# Patient Record
Sex: Female | Born: 1990 | Race: White | Hispanic: No | Marital: Single | Smoking: Current some day smoker
Health system: Southern US, Community
[De-identification: ages and names within clinical notes are randomized; demographics above are authoritative.]

---

## 2014-05-12 ENCOUNTER — Emergency Department (HOSPITAL_COMMUNITY)
Admission: EM | Admit: 2014-05-12 | Discharge: 2014-05-12 | Disposition: A | Discharge status: Home / Self Care | Payer: PRIVATE HEALTH INSURANCE | Attending: Emergency Medicine | Admitting: Emergency Medicine

## 2014-05-12 ENCOUNTER — Emergency Department (HOSPITAL_COMMUNITY): Payer: PRIVATE HEALTH INSURANCE

## 2014-05-12 ENCOUNTER — Encounter (HOSPITAL_COMMUNITY): Payer: Self-pay | Admitting: Emergency Medicine

## 2014-05-12 DIAGNOSIS — Z720 Tobacco use: Secondary | ICD-10-CM | POA: Insufficient documentation

## 2014-05-12 DIAGNOSIS — R109 Unspecified abdominal pain: Secondary | ICD-10-CM

## 2014-05-12 DIAGNOSIS — N23 Unspecified renal colic: Secondary | ICD-10-CM | POA: Insufficient documentation

## 2014-05-12 DIAGNOSIS — Z3202 Encounter for pregnancy test, result negative: Secondary | ICD-10-CM | POA: Insufficient documentation

## 2014-05-12 DIAGNOSIS — N390 Urinary tract infection, site not specified: Secondary | ICD-10-CM | POA: Diagnosis not present

## 2014-05-12 LAB — CBC WITH DIFFERENTIAL/PLATELET
BASOS ABS: 0 10*3/uL (ref 0.0–0.1)
Basophils Relative: 0 % (ref 0–1)
EOS ABS: 0.1 10*3/uL (ref 0.0–0.7)
EOS PCT: 1 % (ref 0–5)
HCT: 41.9 % (ref 36.0–46.0)
HEMOGLOBIN: 14.4 g/dL (ref 12.0–15.0)
LYMPHS PCT: 44 % (ref 12–46)
Lymphs Abs: 3 10*3/uL (ref 0.7–4.0)
MCH: 30 pg (ref 26.0–34.0)
MCHC: 34.4 g/dL (ref 30.0–36.0)
MCV: 87.3 fL (ref 78.0–100.0)
MONO ABS: 0.4 10*3/uL (ref 0.1–1.0)
Monocytes Relative: 6 % (ref 3–12)
Neutro Abs: 3.4 10*3/uL (ref 1.7–7.7)
Neutrophils Relative %: 49 % (ref 43–77)
PLATELETS: 186 10*3/uL (ref 150–400)
RBC: 4.8 MIL/uL (ref 3.87–5.11)
RDW: 11.8 % (ref 11.5–15.5)
WBC: 6.9 10*3/uL (ref 4.0–10.5)

## 2014-05-12 LAB — URINALYSIS, ROUTINE W REFLEX MICROSCOPIC
GLUCOSE, UA: 100 mg/dL — AB
Nitrite: POSITIVE — AB
PH: 6.5 (ref 5.0–8.0)
Protein, ur: 100 mg/dL — AB
Specific Gravity, Urine: >1.03 — ABNORMAL HIGH (ref 1.005–1.030)
Urobilinogen, UA: 2 mg/dL — ABNORMAL HIGH (ref 0.0–1.0)

## 2014-05-12 LAB — COMPREHENSIVE METABOLIC PANEL
ALK PHOS: 62 U/L (ref 39–117)
ALT: 17 U/L (ref 0–35)
ANION GAP: 4 — AB (ref 5–15)
AST: 20 U/L (ref 0–37)
Albumin: 4.4 g/dL (ref 3.5–5.2)
BUN: 7 mg/dL (ref 6–23)
CHLORIDE: 107 mmol/L (ref 96–112)
CO2: 25 mmol/L (ref 19–32)
Calcium: 9.3 mg/dL (ref 8.4–10.5)
Creatinine, Ser: 0.89 mg/dL (ref 0.50–1.10)
GFR calc non Af Amer: >90 mL/min (ref >90)
GLUCOSE: 99 mg/dL (ref 70–99)
POTASSIUM: 3.4 mmol/L — AB (ref 3.5–5.1)
Sodium: 136 mmol/L (ref 135–145)
TOTAL PROTEIN: 7.5 g/dL (ref 6.0–8.3)
Total Bilirubin: 0.4 mg/dL (ref 0.3–1.2)

## 2014-05-12 LAB — URINE MICROSCOPIC-ADD ON

## 2014-05-12 LAB — PREGNANCY, URINE: Preg Test, Ur: NEGATIVE

## 2014-05-12 MED ORDER — KETOROLAC TROMETHAMINE 30 MG/ML IJ SOLN
30.0000 mg | Freq: Once | INTRAMUSCULAR | Status: AC
  Administered 2014-05-12: 30 mg via INTRAVENOUS
  Filled 2014-05-12: qty 1

## 2014-05-12 MED ORDER — OXYCODONE-ACETAMINOPHEN 5-325 MG PO TABS: 1.0000 | ORAL_TABLET | ORAL | Status: AC | PRN

## 2014-05-12 MED ORDER — ONDANSETRON 4 MG PO TBDP: ORAL_TABLET | ORAL | Status: AC

## 2014-05-12 MED ORDER — ONDANSETRON HCL 4 MG/2ML IJ SOLN
INTRAMUSCULAR | Status: AC
  Filled 2014-05-12: qty 2

## 2014-05-12 MED ORDER — ONDANSETRON HCL 4 MG/2ML IJ SOLN
4.0000 mg | Freq: Once | INTRAMUSCULAR | Status: AC
  Administered 2014-05-12: 4 mg via INTRAVENOUS
  Filled 2014-05-12: qty 2

## 2014-05-12 MED ORDER — TAMSULOSIN HCL 0.4 MG PO CAPS: 0.4000 mg | ORAL_CAPSULE | Freq: Every day | ORAL | Status: AC

## 2014-05-12 MED ORDER — CIPROFLOXACIN HCL 500 MG PO TABS: 500.0000 mg | ORAL_TABLET | Freq: Two times a day (BID) | ORAL | Status: AC

## 2014-05-12 MED ORDER — HYDROMORPHONE HCL 1 MG/ML IJ SOLN
0.5000 mg | Freq: Once | INTRAMUSCULAR | Status: AC
  Administered 2014-05-12: 0.5 mg via INTRAVENOUS
  Filled 2014-05-12: qty 1

## 2014-05-12 MED ORDER — HYDROMORPHONE HCL 1 MG/ML IJ SOLN
1.0000 mg | Freq: Once | INTRAMUSCULAR | Status: AC
  Administered 2014-05-12: 1 mg via INTRAVENOUS
  Filled 2014-05-12: qty 1

## 2014-05-12 MED ORDER — KETOROLAC TROMETHAMINE 10 MG PO TABS: 10.0000 mg | ORAL_TABLET | Freq: Four times a day (QID) | ORAL | Status: AC | PRN

## 2014-05-12 MED ORDER — ONDANSETRON HCL 4 MG/2ML IJ SOLN
4.0000 mg | Freq: Once | INTRAMUSCULAR | Status: AC
  Administered 2014-05-12: 4 mg via INTRAVENOUS

## 2014-05-12 NOTE — ED Provider Notes (Signed)
CSN: [id number]     Arrival date & time 05/12/14  0543 History   First MD Initiated Contact with Patient 05/12/14 [phone]     Chief Complaint  Patient presents with  . Flank Pain     (Consider location/radiation/quality/duration/timing/severity/associated sxs/prior Treatment) HPI Patient presents with acute onset left flank pain radiating around to the left abdomen starting yesterday. Associated with nausea and vomiting. States the pain comes in waves. Denies any fever chills. No dysuria, frequency, hematuria. Patient with extensive family history of kidney stones but no personal history. No similar symptoms in the past. History reviewed. No pertinent past medical history. History reviewed. No pertinent past surgical history. History reviewed. No pertinent family history. History  Substance Use Topics  . Smoking status: Current Some Day Smoker  . Smokeless tobacco: Not on file  . Alcohol Use: No   OB History    No data available     Review of Systems  Constitutional: Negative for fever and chills.  Respiratory: Negative for cough and shortness of breath.   Cardiovascular: Negative for chest pain.  Gastrointestinal: Positive for nausea, vomiting and abdominal pain. Negative for diarrhea and constipation.  Genitourinary: Positive for flank pain. Negative for dysuria, hematuria and difficulty urinating.  Musculoskeletal: Positive for back pain. Negative for neck pain and neck stiffness.  Skin: Negative for rash and wound.  Neurological: Negative for dizziness, weakness, light-headedness, numbness and headaches.  All other systems reviewed and are negative.     Allergies  Nickel  Home Medications   Prior to Admission medications   Medication Sig Start Date End Date Taking? Authorizing Provider  ciprofloxacin (CIPRO) 500 MG tablet Take 1 tablet (500 mg total) by mouth 2 (two) times daily. One po bid x 7 days 05/12/14   Loren Raceravid Sharda Keddy, MD  ketorolac (TORADOL) 10 MG tablet Take 1  tablet (10 mg total) by mouth every 6 (six) hours as needed. 05/12/14   Loren Raceravid Richards Pherigo, MD  ondansetron (ZOFRAN ODT) 4 MG disintegrating tablet 4mg  ODT q4 hours prn nausea/vomit 05/12/14   Loren Raceravid Ailish Prospero, MD  oxyCODONE-acetaminophen (PERCOCET) 5-325 MG per tablet Take 1-2 tablets by mouth every 4 (four) hours as needed for severe pain. 05/12/14   Loren Raceravid Nemiah Kissner, MD  tamsulosin (FLOMAX) 0.4 MG CAPS capsule Take 1 capsule (0.4 mg total) by mouth daily. 05/12/14   Loren Raceravid Kaidon Kinker, MD   BP 121/94 mmHg  Pulse 73  Temp(Src) 98 F (36.7 C) (Oral)  Resp 18  Ht 5\' 1"  (1.549 m)  Wt 116 lb (52.617 kg)  BMI 21.93 kg/m2  SpO2 99%  LMP 05/04/2014 Physical Exam  Constitutional: She is oriented to person, place, and time. She appears well-developed and well-nourished. She appears distressed.  Patient is very uncomfortable appearing  HENT:  Head: Normocephalic and atraumatic.  Mouth/Throat: Oropharynx is clear and moist.  Eyes: EOM are normal. Pupils are equal, round, and reactive to light.  Neck: Normal range of motion. Neck supple.  Cardiovascular: Normal rate and regular rhythm.   Pulmonary/Chest: Effort normal and breath sounds normal. No respiratory distress. She has no wheezes. She has no rales.  Abdominal: Soft. Bowel sounds are normal. She exhibits no distension and no mass. There is tenderness (mild left-sided abdominal tenderness to palpation. No rebound or guarding.). There is no rebound and no guarding.  Musculoskeletal: Normal range of motion. She exhibits no edema or tenderness.  Left flank tenderness  Neurological: She is alert and oriented to person, place, and time.  Skin: Skin is warm and dry.  No rash noted. No erythema.  Psychiatric: She has a normal mood and affect. Her behavior is normal.  Nursing note and vitals reviewed.   ED Course  Procedures (including critical care time) Labs Review Labs Reviewed  COMPREHENSIVE METABOLIC PANEL - Abnormal; Notable for the following:     Potassium 3.4 (*)    Anion gap 4 (*)    All other components within normal limits  URINALYSIS, ROUTINE W REFLEX MICROSCOPIC - Abnormal; Notable for the following:    Color, Urine AMBER (*)    APPearance CLOUDY (*)    Specific Gravity, Urine >1.030 (*)    Glucose, UA 100 (*)    Hgb urine dipstick LARGE (*)    Bilirubin Urine MODERATE (*)    Ketones, ur TRACE (*)    Protein, ur 100 (*)    Urobilinogen, UA 2.0 (*)    Nitrite POSITIVE (*)    Leukocytes, UA TRACE (*)    All other components within normal limits  URINE MICROSCOPIC-ADD ON - Abnormal; Notable for the following:    Bacteria, UA MANY (*)    All other components within normal limits  CBC WITH DIFFERENTIAL/PLATELET  PREGNANCY, URINE    Imaging Review No results found.   EKG Interpretation None      MDM   Final diagnoses:  Left flank pain  Renal colic on left side  UTI (lower urinary tract infection)    Patient's pain is improved. Continues to have no fever. Normal white blood cell count. Evidence of urinary tract infection on UA. Discussed with urology, Dr.Dahlstedt. Recommended starting patient on Cipro and given strict return precautions to return immediately if she develops fever. Can follow up as outpatient with urology clinic. Patient and family comfortable with plan.    Loren Racer, MD 05/17/14 [phone]

## 2014-05-12 NOTE — Discharge Instructions (Signed)
Return immediately for worsening pain, persistent vomiting, fever or for any concerns.  Kidney Stones Kidney stones (urolithiasis) are deposits that form inside your kidneys. The intense pain is caused by the stone moving through the urinary tract. When the stone moves, the ureter goes into spasm around the stone. The stone is usually passed in the urine.  CAUSES   A disorder that makes certain neck glands produce too much parathyroid hormone (primary hyperparathyroidism).  A buildup of uric acid crystals, similar to gout in your joints.  Narrowing (stricture) of the ureter.  A kidney obstruction present at birth (congenital obstruction).  Previous surgery on the kidney or ureters.  Numerous kidney infections. SYMPTOMS   Feeling sick to your stomach (nauseous).  Throwing up (vomiting).  Blood in the urine (hematuria).  Pain that usually spreads (radiates) to the groin.  Frequency or urgency of urination. DIAGNOSIS   Taking a history and physical exam.  Blood or urine tests.  CT scan.  Occasionally, an examination of the inside of the urinary bladder (cystoscopy) is performed. TREATMENT   Observation.  Increasing your fluid intake.  Extracorporeal shock wave lithotripsy--This is a noninvasive procedure that uses shock waves to break up kidney stones.  Surgery may be needed if you have severe pain or persistent obstruction. There are various surgical procedures. Most of the procedures are performed with the use of small instruments. Only small incisions are needed to accommodate these instruments, so recovery time is minimized. The size, location, and chemical composition are all important variables that will determine the proper choice of action for you. Talk to your health care provider to better understand your situation so that you will minimize the risk of injury to yourself and your kidney.  HOME CARE INSTRUCTIONS   Drink enough water and fluids to keep your urine  clear or pale yellow. This will help you to pass the stone or stone fragments.  Strain all urine through the provided strainer. Keep all particulate matter and stones for your health care provider to see. The stone causing the pain may be as small as a grain of salt. It is very important to use the strainer each and every time you pass your urine. The collection of your stone will allow your health care provider to analyze it and verify that a stone has actually passed. The stone analysis will often identify what you can do to reduce the incidence of recurrences.  Only take over-the-counter or prescription medicines for pain, discomfort, or fever as directed by your health care provider.  Make a follow-up appointment with your health care provider as directed.  Get follow-up X-rays if required. The absence of pain does not always mean that the stone has passed. It may have only stopped moving. If the urine remains completely obstructed, it can cause loss of kidney function or even complete destruction of the kidney. It is your responsibility to make sure X-rays and follow-ups are completed. Ultrasounds of the kidney can show blockages and the status of the kidney. Ultrasounds are not associated with any radiation and can be performed easily in a matter of minutes. SEEK MEDICAL CARE IF:  You experience pain that is progressive and unresponsive to any pain medicine you have been prescribed. SEEK IMMEDIATE MEDICAL CARE IF:   Pain cannot be controlled with the prescribed medicine.  You have a fever or shaking chills.  The severity or intensity of pain increases over 18 hours and is not relieved by pain medicine.  You develop  a new onset of abdominal pain.  You feel faint or pass out.  You are unable to urinate. MAKE SURE YOU:   Understand these instructions.  Will watch your condition.  Will get help right away if you are not doing well or get worse. Document Released: 03/05/2005 Document  Revised: 11/05/2012 Document Reviewed: 08/06/2012 [facility] Patient Information 2015 [place], [place]. This information is not intended to replace advice given to you by your health care provider. Make sure you discuss any questions you have with your health care provider.  Urinary Tract Infection Urinary tract infections (UTIs) can develop anywhere along your urinary tract. Your urinary tract is your body's drainage system for removing wastes and extra water. Your urinary tract includes two kidneys, two ureters, a bladder, and a urethra. Your kidneys are a pair of bean-shaped organs. Each kidney is about the size of your fist. They are located below your ribs, one on each side of your spine. CAUSES Infections are caused by microbes, which are microscopic organisms, including fungi, viruses, and bacteria. These organisms are so small that they can only be seen through a microscope. Bacteria are the microbes that most commonly cause UTIs. SYMPTOMS  Symptoms of UTIs may vary by age and gender of the patient and by the location of the infection. Symptoms in young women typically include a frequent and intense urge to urinate and a painful, burning feeling in the bladder or urethra during urination. Older women and men are more likely to be tired, shaky, and weak and have muscle aches and abdominal pain. A fever may mean the infection is in your kidneys. Other symptoms of a kidney infection include pain in your back or sides below the ribs, nausea, and vomiting. DIAGNOSIS To diagnose a UTI, your caregiver will ask you about your symptoms. Your caregiver also will ask to provide a urine sample. The urine sample will be tested for bacteria and white blood cells. White blood cells are made by your body to help fight infection. TREATMENT  Typically, UTIs can be treated with medication. Because most UTIs are caused by a bacterial infection, they usually can be treated with the use of antibiotics. The choice of  antibiotic and length of treatment depend on your symptoms and the type of bacteria causing your infection. HOME CARE INSTRUCTIONS  If you were prescribed antibiotics, take them exactly as your caregiver instructs you. Finish the medication even if you feel better after you have only taken some of the medication.  Drink enough water and fluids to keep your urine clear or pale yellow.  Avoid caffeine, tea, and carbonated beverages. They tend to irritate your bladder.  Empty your bladder often. Avoid holding urine for long periods of time.  Empty your bladder before and after sexual intercourse.  After a bowel movement, women should cleanse from front to back. Use each tissue only once. SEEK MEDICAL CARE IF:   You have back pain.  You develop a fever.  Your symptoms do not begin to resolve within 3 days. SEEK IMMEDIATE MEDICAL CARE IF:   You have severe back pain or lower abdominal pain.  You develop chills.  You have nausea or vomiting.  You have continued burning or discomfort with urination. MAKE SURE YOU:   Understand these instructions.  Will watch your condition.  Will get help right away if you are not doing well or get worse. Document Released: 12/13/2004 Document Revised: 09/04/2011 Document Reviewed: 04/13/2011 [facility] Patient Information 2015 [place], [place]. This information is  not intended to replace advice given to you by your health care provider. Make sure you discuss any questions you have with your health care provider. ° °

## 2014-05-12 NOTE — ED Notes (Signed)
Patient reports left side flank pain that started yesterday and worsened this morning. States nausea and vomiting as well.

## 2016-10-03 IMAGING — CT CT RENAL STONE PROTOCOL
3 of 4 series · 9 of 46 positions shown, 16 images · non-contrast
Comparison: None.

CLINICAL DATA: Left flank pain

EXAM:
CT ABDOMEN AND PELVIS WITHOUT CONTRAST
TECHNIQUE: Multidetector CT imaging of the abdomen and pelvis was performed
following the standard protocol without oral or intravenous contrast
material administration.

[Series 3: mpr coronal 3.0mm · coronal · 0.67mm/px · 3 of 63 slices shown, 4 images]
[im 21/63  soft-tissue]
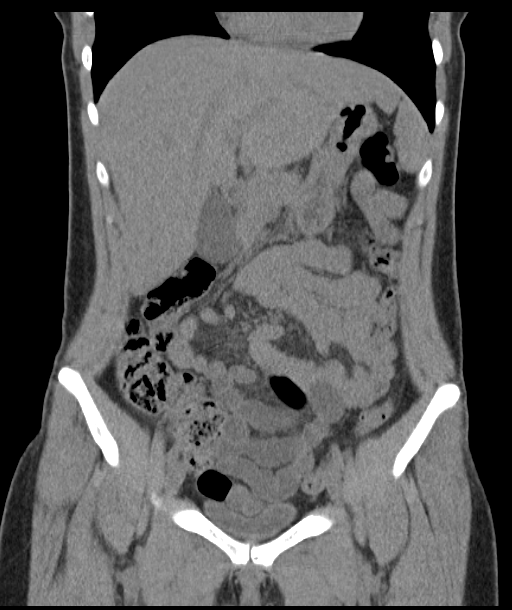
[im 28/63  soft-tissue]
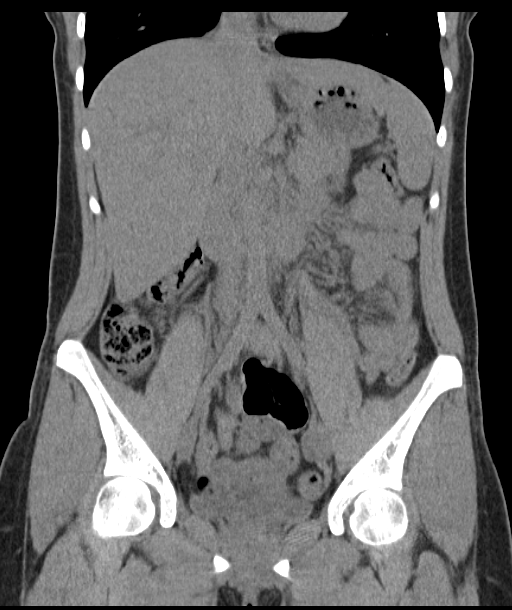
[im 28/63  bone]
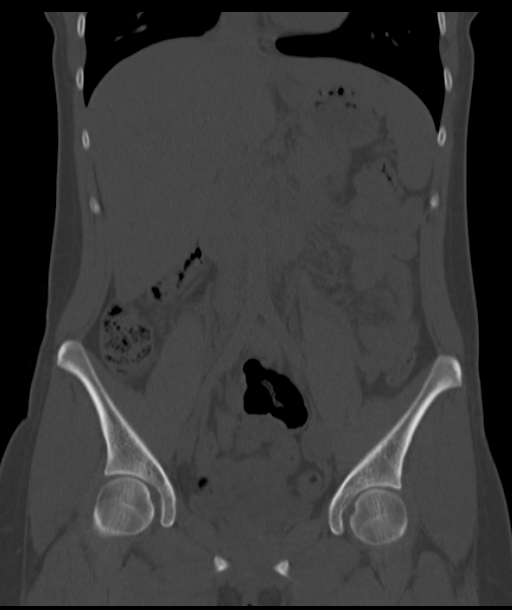
[im 35/63  soft-tissue]
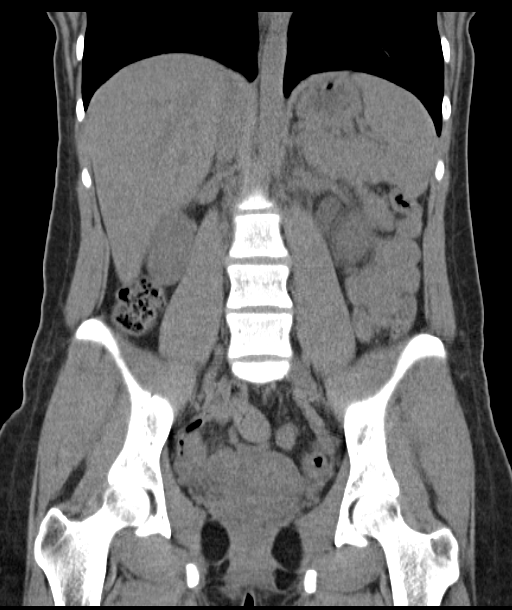

[Series 4: mpr sagittal 3.0mm · sagittal · 0.40mm/px · 1 of 100 slices shown, 2 images]
[im 34/100  soft-tissue]
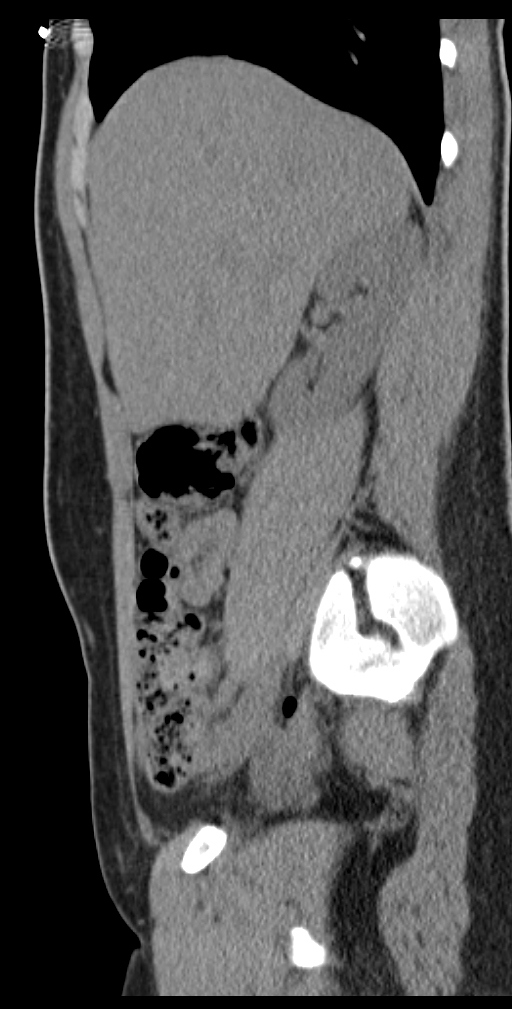
[im 34/100  bone]
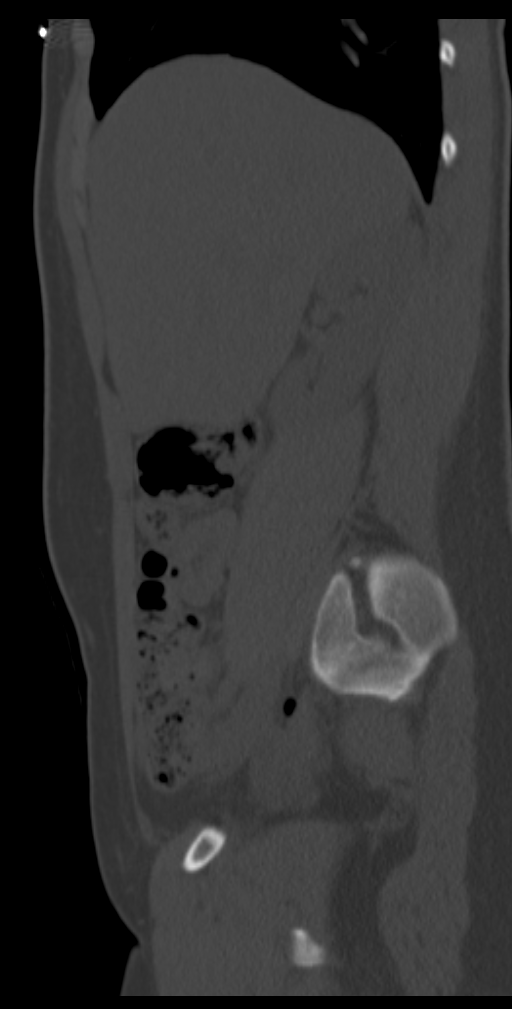

[Series 6: lung 5.0 b60f · axial · 0.66mm/px · z∈[-152,-72]mm · 5 of 24 slices shown, 10 images]
[im 4/24  soft-tissue]
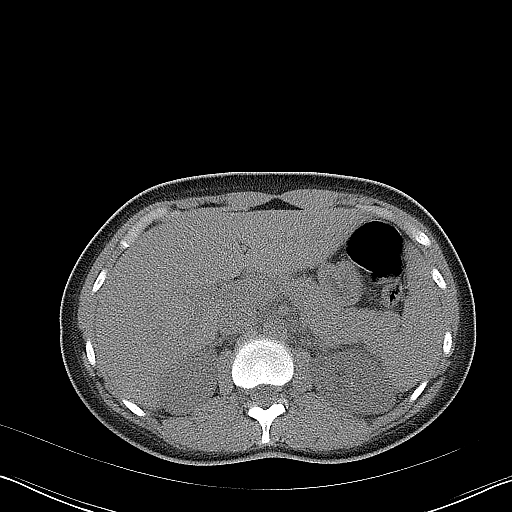
[im 4/24  bone]
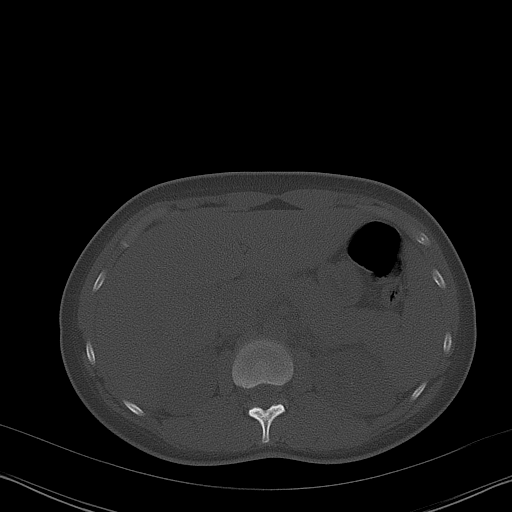
[im 8/24  soft-tissue]
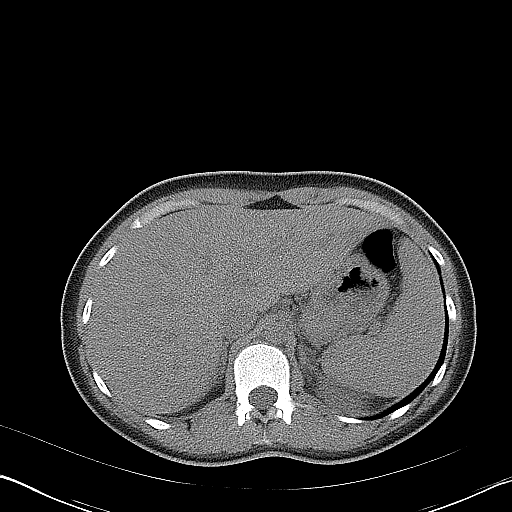
[im 8/24  lung]
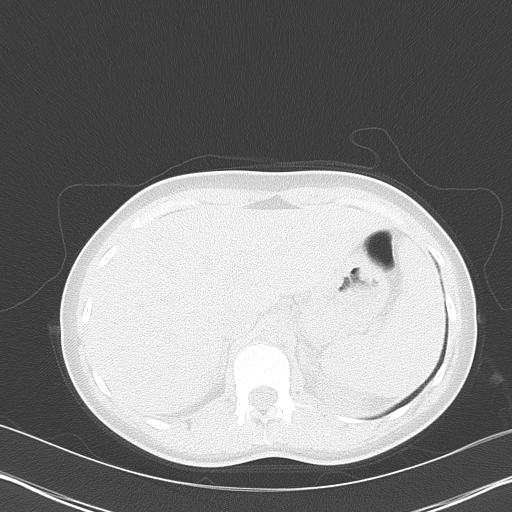
[im 12/24  soft-tissue]
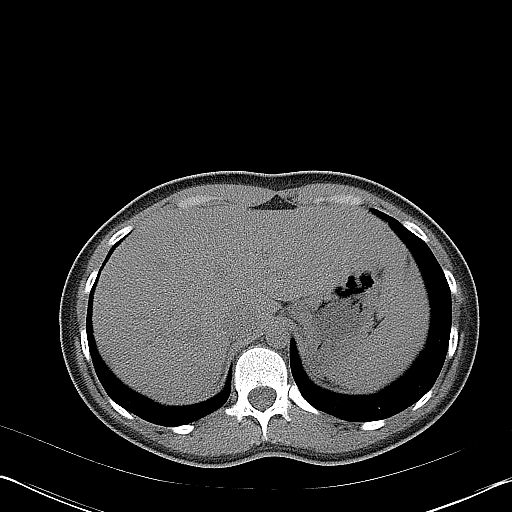
[im 12/24  lung]
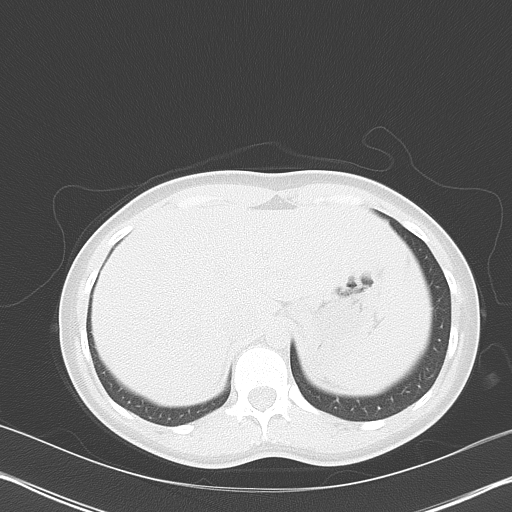
[im 16/24  soft-tissue]
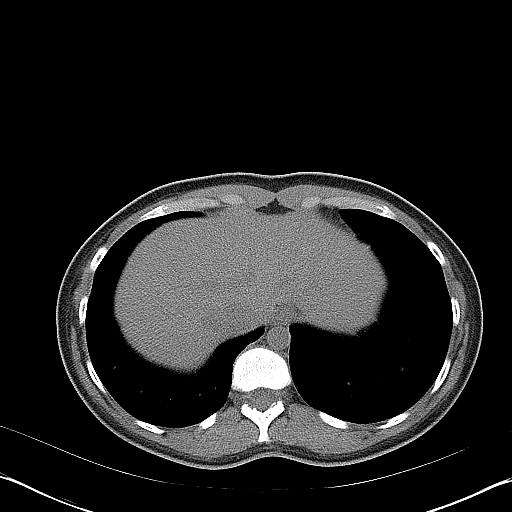
[im 16/24  lung]
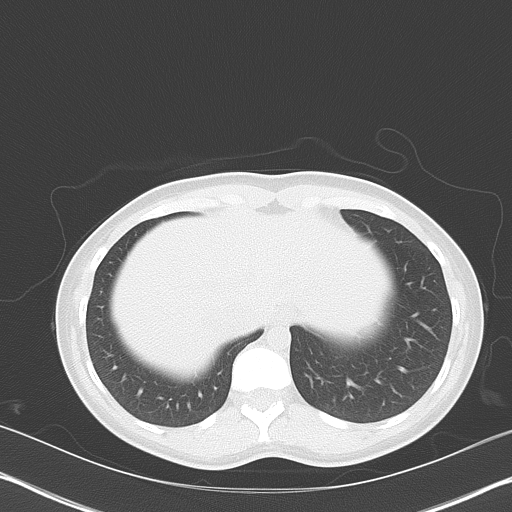
[im 20/24  soft-tissue]
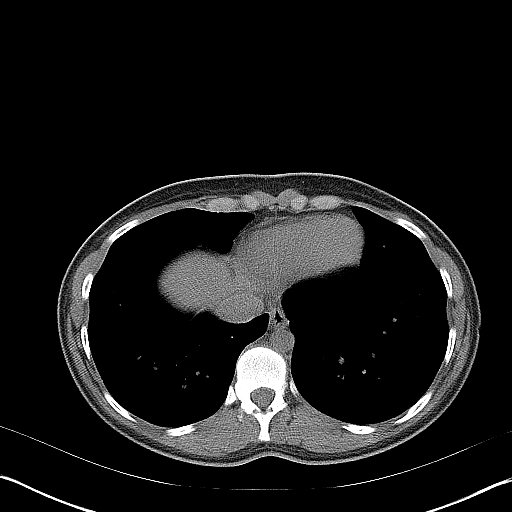
[im 20/24  lung]
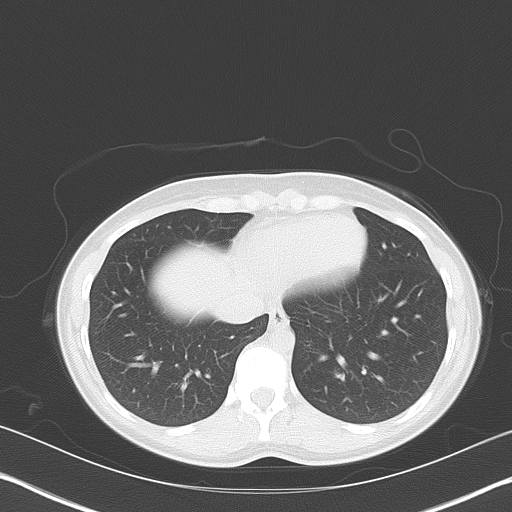

[9 of 46 positions shown; findings below may reference images not displayed]

FINDINGS: On axial slice 1 series 6, there is a 3 mm nodular opacity in the
inferior lingula. Lung bases are otherwise clear.

Liver is prominent measuring 18.1 cm in length. No focal liver
lesions are identified on this noncontrast enhanced CT. Gallbladder
wall does appreciably thickened. There is no biliary duct
dilatation.

Spleen, pancreas, and adrenals appear normal.

There is a 1 mm calculus in mid right kidney, nonobstructing. There
is a 1 mm calculus in the lower pole of the right kidney,
nonobstructing. There is no right renal mass or hydronephrosis.
There is no right ureteral calculus.

There is moderate hydronephrosis on the left. There is a 1 mm
nonobstructing calculus in the upper pole left kidney region. There
is no left renal mass. There is a 3 mm calculus in the distal left
ureter at the level of the mid acetabulum causing hydronephrosis. No
other ureteral calculi are identified.

In the pelvis, the urinary bladder is decompressed. There is no wall
thickening. There is no pelvic mass or pelvic fluid collection.
Appendix appears normal.

There is no bowel obstruction. No free air or portal venous air.
There is no ascites, adenopathy, or abscess in the an or pelvis.
Aorta appears normal. There are no blastic or lytic bone lesions.
IMPRESSION: 3 mm calculus distal left ureter causing moderate left-sided
hydronephrosis. There are tiny nonobstructing calculi in each
kidney.

No bowel obstruction.  No abscess.  Appendix region appears normal.

3 mm nodular opacity inferior lingula. Followup of this nodular
opacity should be based on [HOSPITAL] guidelines. If the
patient is at high risk for bronchogenic carcinoma, follow-up chest
CT at 1 year is recommended. If the patient is at low risk, no
follow-up is needed. This recommendation follows the consensus
statement: Guidelines for Management of Small Pulmonary Nodules
Detected on CT Scans: A Statement from the [HOSPITAL] as
